# Patient Record
Sex: Male | Born: 1975 | Race: White | Hispanic: No | Marital: Married | Smoking: Never smoker
Health system: Southern US, Community
[De-identification: ages and names within clinical notes are randomized; demographics above are authoritative.]

## PROBLEM LIST (undated history)

## (undated) DIAGNOSIS — F32A Depression, unspecified: Secondary | ICD-10-CM

---

## 2008-06-26 ENCOUNTER — Ambulatory Visit: Payer: Self-pay | Admitting: Sports Medicine

## 2008-06-26 DIAGNOSIS — M629 Disorder of muscle, unspecified: Secondary | ICD-10-CM

## 2009-11-04 ENCOUNTER — Ambulatory Visit: Payer: Self-pay | Admitting: Emergency Medicine

## 2009-11-04 DIAGNOSIS — S6990XA Unspecified injury of unspecified wrist, hand and finger(s), initial encounter: Secondary | ICD-10-CM

## 2009-11-04 DIAGNOSIS — S59909A Unspecified injury of unspecified elbow, initial encounter: Secondary | ICD-10-CM

## 2009-11-04 DIAGNOSIS — S59919A Unspecified injury of unspecified forearm, initial encounter: Secondary | ICD-10-CM

## 2009-11-12 ENCOUNTER — Telehealth: Payer: Self-pay | Admitting: Family Medicine

## 2009-11-12 ENCOUNTER — Telehealth (INDEPENDENT_AMBULATORY_CARE_PROVIDER_SITE_OTHER): Payer: Self-pay | Admitting: *Deleted

## 2010-01-01 ENCOUNTER — Ambulatory Visit: Payer: Self-pay | Admitting: Emergency Medicine

## 2010-04-17 ENCOUNTER — Encounter: Payer: Self-pay | Admitting: Sports Medicine

## 2010-04-26 NOTE — Assessment & Plan Note (Signed)
Summary: LEFT WRIST PAIN   Vital Signs:  Patient Profile:   35 Years Old Male CC:      Left wrist pain from fall last night Height:     66 inches Weight:      153 pounds O2 Sat:      100 % O2 treatment:    Room Air Temp:     97.8 degrees F oral Pulse rate:   53 / minute Pulse rhythm:   regular Resp:     12 per minute BP sitting:   119 / 77  (left arm) Cuff size:   regular  Pt. in pain?   yes    Location:   wrist    Intensity:   8    Type:       sharp  Vitals Entered By: Emilio Math (November 04, 2009 10:05 AM)                 History of Present Illness Chief Complaint: Left wrist pain from fall last night History of Present Illness: 35 yo M here for left wrist pain.  Patient reports was playing softball last night and during game fell onto outstretched left wrist.  Is right handed.  No prior fracture of this wrist.  Maybe some mild swelling.  Feels very stiff now.  No numbness or tingling.  Pain ulnar side of wrist.  REVIEW OF SYSTEMS Constitutional Symptoms      Denies fever, chills, night sweats, weight loss, weight gain, and fatigue.  Eyes       Denies change in vision, eye pain, eye discharge, glasses, contact lenses, and eye surgery. Ear/Nose/Throat/Mouth       Denies hearing loss/aids, change in hearing, ear pain, ear discharge, dizziness, frequent runny nose, frequent nose bleeds, sinus problems, sore throat, hoarseness, and tooth pain or bleeding.  Respiratory       Denies dry cough, productive cough, wheezing, shortness of breath, asthma, bronchitis, and emphysema/COPD.  Cardiovascular       Denies murmurs, chest pain, and tires easily with exhertion.    Gastrointestinal       Denies stomach pain, nausea/vomiting, diarrhea, constipation, blood in bowel movements, and indigestion. Genitourniary       Denies painful urination, kidney stones, and loss of urinary control. Neurological       Denies paralysis, seizures, and fainting/blackouts. Musculoskeletal     Complains of muscle pain, joint pain, joint stiffness, and decreased range of motion.      Denies redness, swelling, muscle weakness, and gout.  Skin       Denies bruising, unusual mles/lumps or sores, and hair/skin or nail changes.  Psych       Denies mood changes, temper/anger issues, anxiety/stress, speech problems, depression, and sleep problems.  Past History:  Past Medical History: Reviewed history from 06/26/2008 and no changes required. 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 Non smoker ETOH-yes No [employer] Race team Physical Exam General appearance: well developed, well nourished, no acute distress Extremities: L wrist: No gross deformity, swelling, or bruising.  TTP at [facility].  No focal bony TTP ulnar styloid, carpal bones.  ROM full flexion but only 30 degrees extension.  Sensation intact to light touch distally.  No malrotation/angulation fo fingers.  Cap refill < 2 secs. Assessment New Problems: WRIST INJURY, LEFT (ICD-959.3)  TFCC injury - contusion vs tear.  Patient Education: seen by Dr.  Hudnall  Plan New Orders: T-DG Wrist Complete*L* [73110] New Patient Level II [99202] Wrist/Thumb Spica [L3923] Planning Comments:   Treat conservatively - wrist splint to wear regularly for next 10-14 days, icing, nsaids.  Then start rehab exercises. If worsening instead of improving may need mr arthrogram to further characterize tear and consider arthroscopy - given number for orthopedic physicians.  Follow Up: Follow up on an as needed basis  The patient and/or caregiver has been counseled thoroughly with regard to medications prescribed including dosage, schedule, interactions, rationale for use, and possible side effects and they verbalize understanding.  Diagnoses and expected course of recovery discussed and will return if not improved as  expected or if the condition worsens. Patient and/or caregiver verbalized understanding.   Patient Instructions: 1)  Ice area 3-4 times a day for 15 minutes at a time 2)  Wear the wrist brace as much as possible, even when sleeping.  Can take this off if necessary.  Can take off to shower as well. 3)  Aleve 2 tabs twice a day with food for 7 days then as needed for pain and inflammation. 4)  After 10 days if you have improved, can come out of wrist brace and do simple motion exercises (see handout) to get your motion back.  5)  If the same or worse in 10-14 days, call [phone] to set up an appointment with the orthopedic doctors downstairs.  Orders Added: 1)  T-DG Wrist Complete*L* [73110] 2)  New Patient Level II [99202] 3)  Wrist/Thumb Spica [L3923]

## 2010-04-26 NOTE — Miscellaneous (Signed)
Summary: Directives and Consents  Directives and Consents   Imported By: Joanne Chars CMA 01/01/2010 15:56:00  _____________________________________________________________________  External Attachment:    Type:   Image     Comment:   External Document

## 2010-04-26 NOTE — Progress Notes (Signed)
  Phone Note Call from Patient   Caller: Patient Summary of Call: Patient called states that his wrist is not any better. Does the patient need to come back in to be seen? If you need to contact his the best number is [phone]. Initial call taken by: Dannette Barbara,  November 12, 2009 9:55 AM    Returned call; patient reports that he has had no improvement while wearing splint.  Still has sharp pain on dorsal ulnar aspect of wrist with movement.  Will refer to orthopedist or sports med specialist. Donna Christen MD  November 12, 2009 11:33 AM

## 2010-04-26 NOTE — Assessment & Plan Note (Signed)
Summary: FLU SHOT/TM   Flu Vaccine Consent Questions     Do you have a history of severe allergic reactions to this vaccine? no    Any prior history of allergic reactions to egg and/or gelatin? no    Do you have a sensitivity to the preservative Thimersol? no    Do you have a past history of Guillan-Barre Syndrome? no    Do you currently have an acute febrile illness? no 98.3    Have you ever had a severe reaction to latex? no    Vaccine information given and explained to patient? yes    Are you currently pregnant? no    Lot Number: [id number]   Exp Date: 09/24/2010   Manufacturer: [employer]    Site Given  Right Deltoid IM Areta Haber CMA  January 01, 2010 11:49 AM       Orders Added: 1)  Admin 1st Vaccine [90471] 2)  Flu Vaccine 5yrs + [29528]

## 2010-04-26 NOTE — Progress Notes (Signed)
  Phone Note From Other Clinic   Caller: Appointment Secretary Summary of Call: Patient scheduled with Dr Dorthula [facility].  Scheduled Wednesday August 24,2011 at 8:45 am at the [facility] location. Initial call taken by: Junius Finner,  November 12, 2009 2:44 PM

## 2013-09-04 ENCOUNTER — Encounter (HOSPITAL_COMMUNITY): Payer: Self-pay

## 2013-09-04 ENCOUNTER — Ambulatory Visit (HOSPITAL_COMMUNITY)
Admission: RE | Admit: 2013-09-04 | Discharge: 2013-09-04 | Disposition: A | Discharge status: Home / Self Care | Payer: BC Managed Care – PPO | Source: Ambulatory Visit | Attending: Family Medicine | Admitting: Family Medicine

## 2013-09-04 ENCOUNTER — Other Ambulatory Visit (HOSPITAL_COMMUNITY): Payer: Self-pay | Admitting: Family Medicine

## 2013-09-04 DIAGNOSIS — R51 Headache: Secondary | ICD-10-CM

## 2014-12-05 IMAGING — CT CT HEAD W/O CM
1 series · 16 of 30 positions shown, 20 images · non-contrast
Comparison: None.

CLINICAL DATA: Headaches.

EXAM:
CT HEAD WITHOUT CONTRAST
TECHNIQUE: Contiguous axial images were obtained from the base of the skull
through the vertex without intravenous contrast.

[Series 2: headtrauma 4.8 h37s · axial · 0.45mm/px · z∈[+148,+303]mm · 16 of 36 slices shown, 20 images]
[im 2/36  brain]
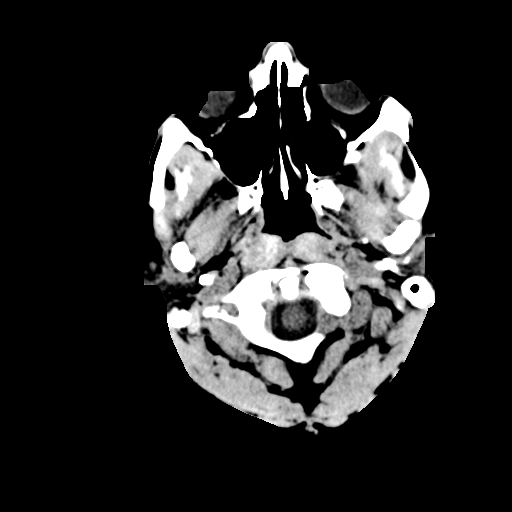
[im 2/36  bone]
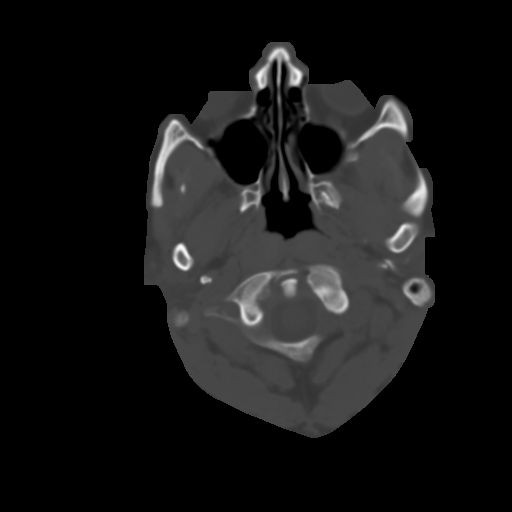
[im 4/36  brain]
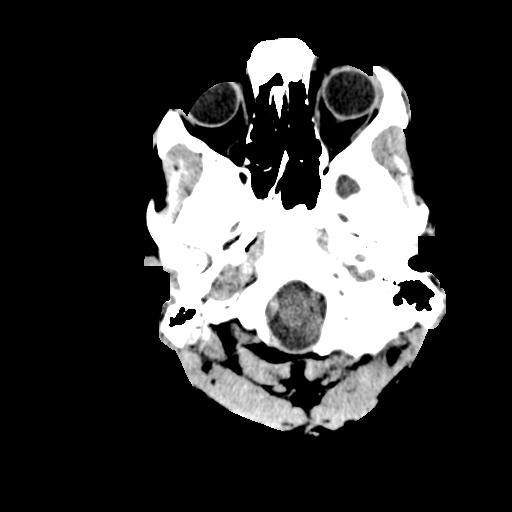
[im 7/36  brain]
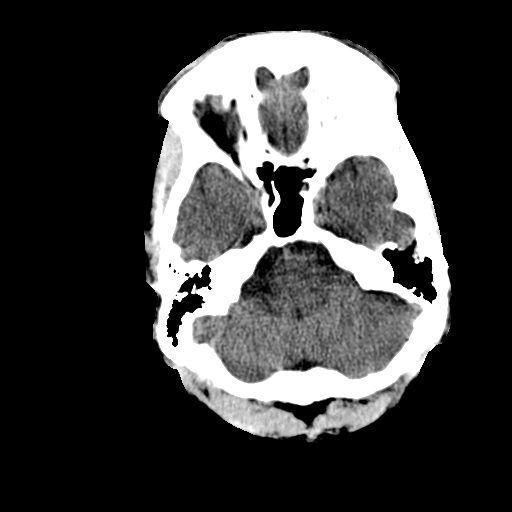
[im 9/36  brain]
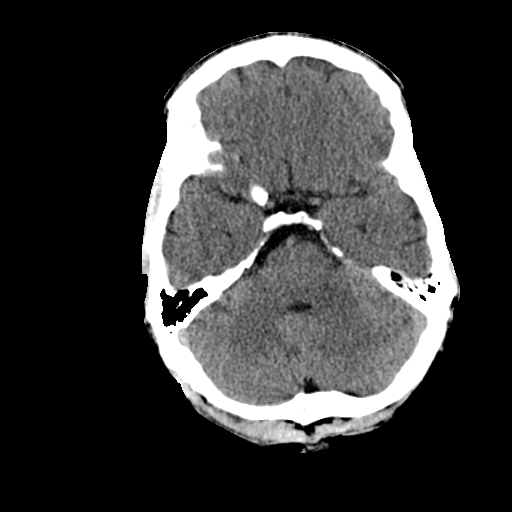
[im 10/36  brain]
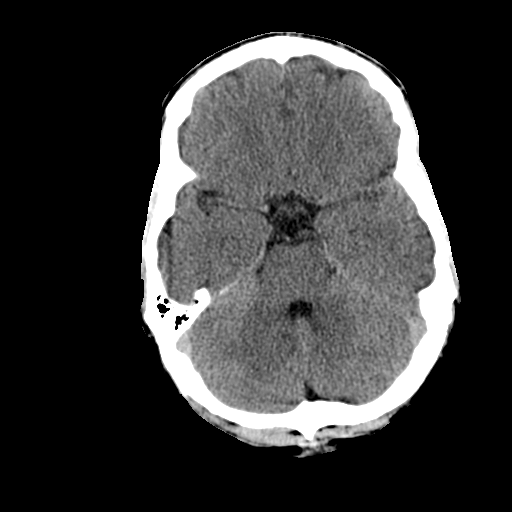
[im 10/36  bone]
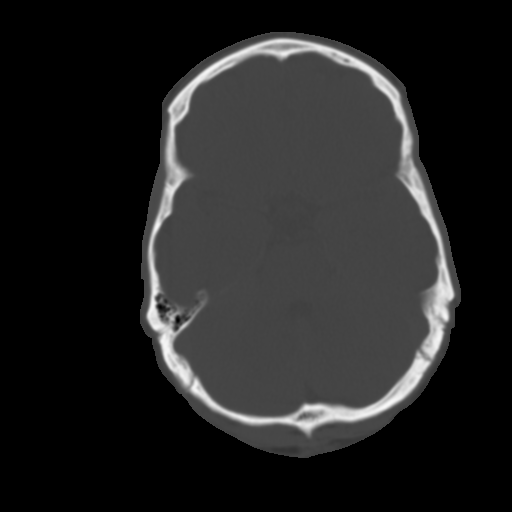
[im 13/36  brain]
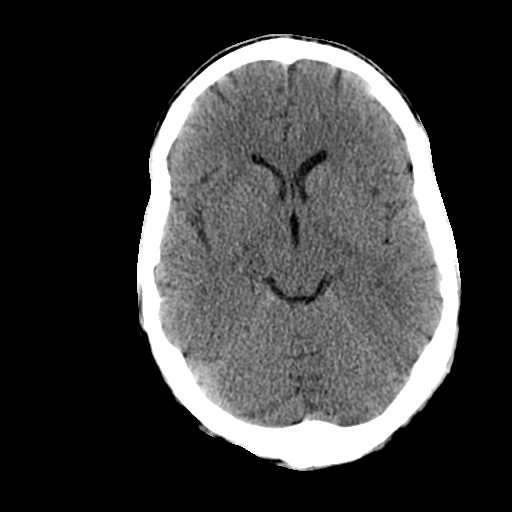
[im 15/36  brain]
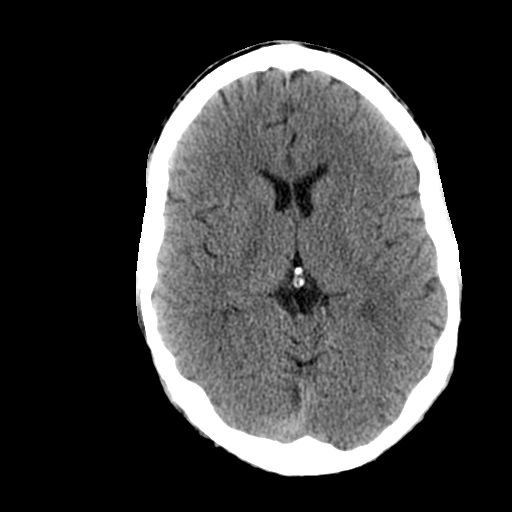
[im 17/36  brain]
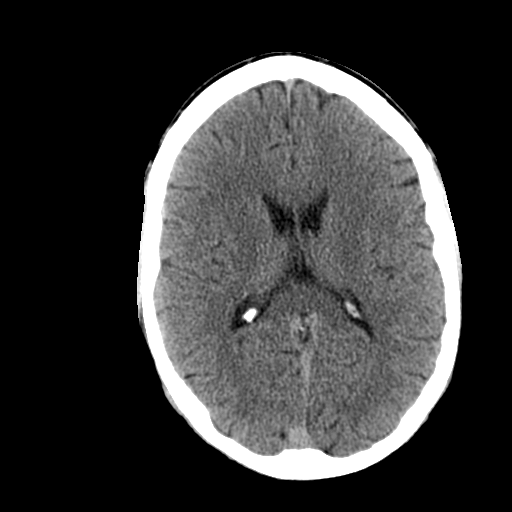
[im 19/36  brain]
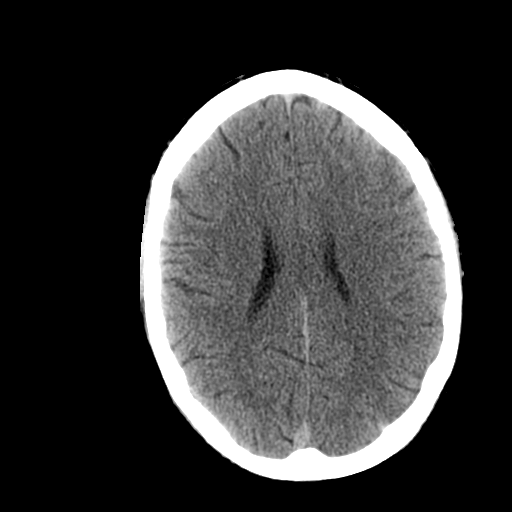
[im 19/36  bone]
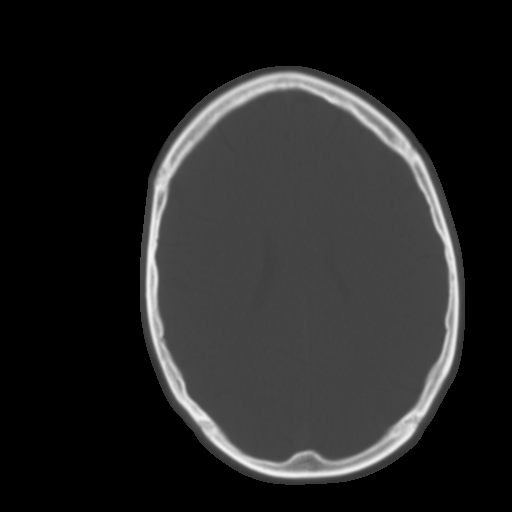
[im 21/36  brain]
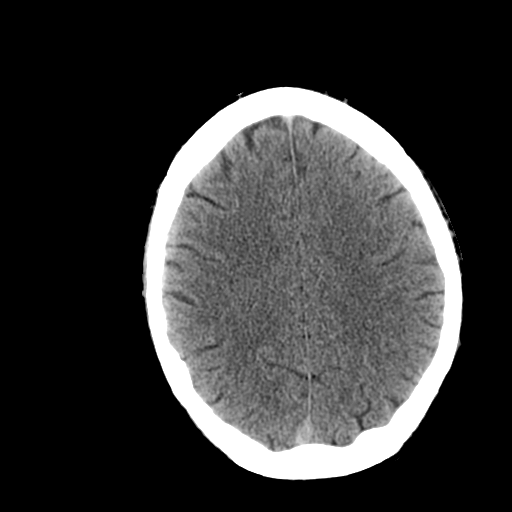
[im 23/36  brain]
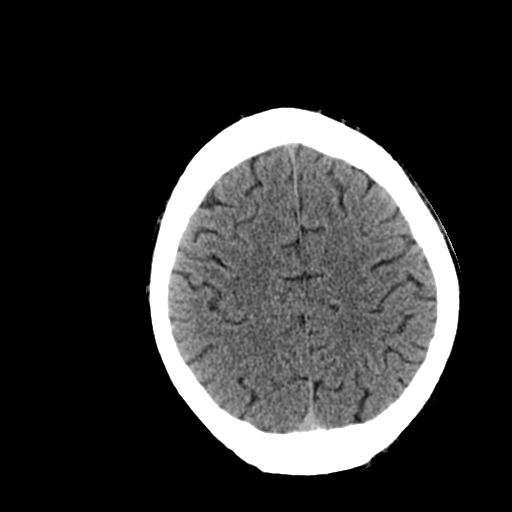
[im 26/36  brain]
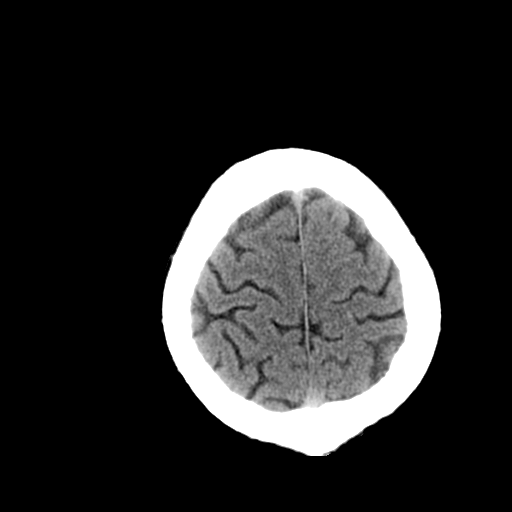
[im 27/36  brain]
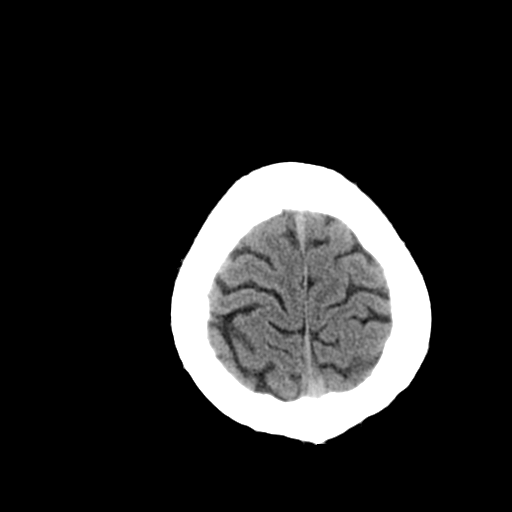
[im 27/36  bone]
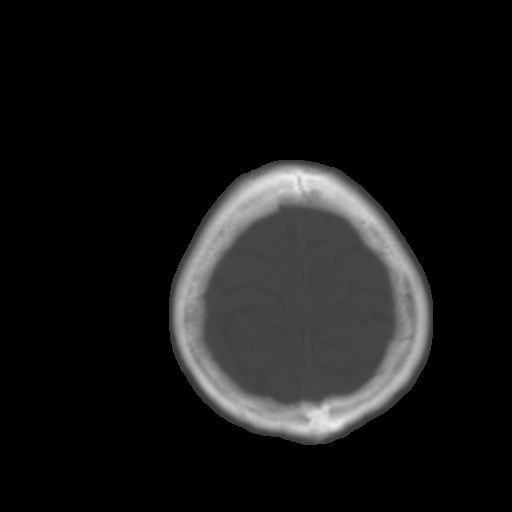
[im 29/36  brain]
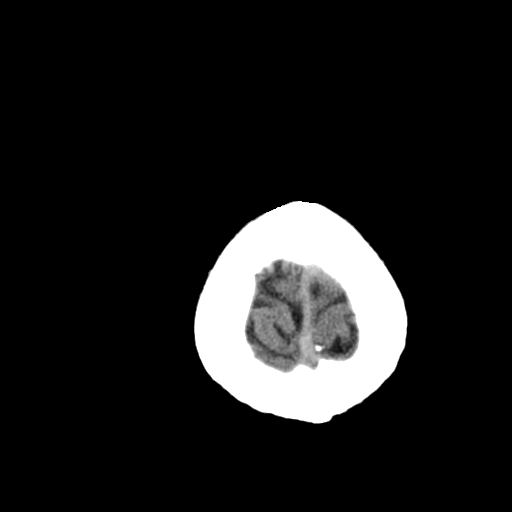
[im 32/36  brain]
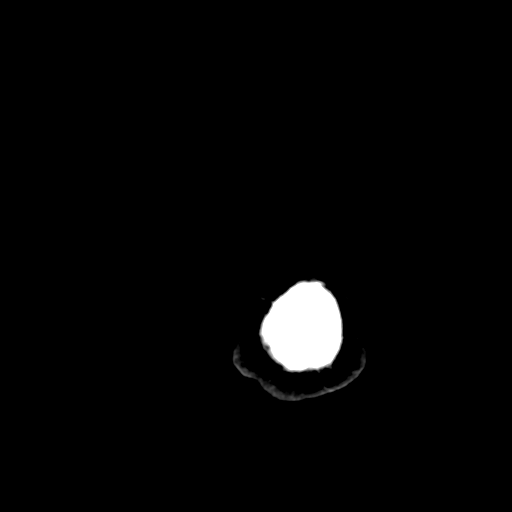
[im 34/36  brain]
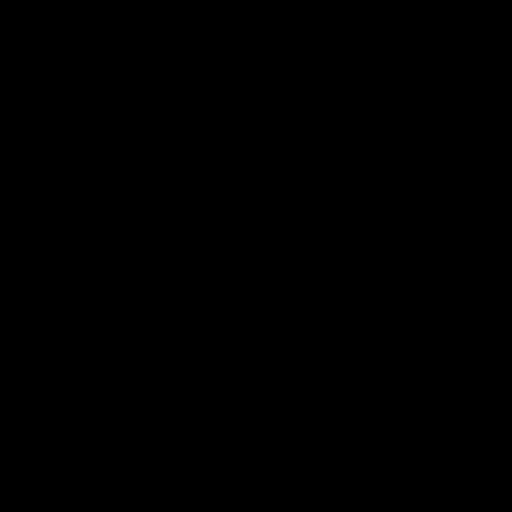

[16 of 30 positions shown; findings below may reference images not displayed]

FINDINGS: The ventricles are normal in size and configuration. No extra-axial
fluid collections are identified. The gray-white differentiation is
normal. No CT findings for acute intracranial process such as
hemorrhage or infarction. No mass lesions. The brainstem and
cerebellum are grossly normal.

The bony structures are intact. The paranasal sinuses and mastoid
air cells are clear. The globes are intact.
IMPRESSION: No acute intracranial findings or mass lesions.

## 2016-11-06 ENCOUNTER — Encounter: Payer: Self-pay | Admitting: Emergency Medicine

## 2016-11-06 ENCOUNTER — Emergency Department (INDEPENDENT_AMBULATORY_CARE_PROVIDER_SITE_OTHER)
Admission: EM | Admit: 2016-11-06 | Discharge: 2016-11-06 | Disposition: A | Discharge status: Home / Self Care | Payer: BLUE CROSS/BLUE SHIELD | Source: Home / Self Care | Attending: Family Medicine | Admitting: Family Medicine

## 2016-11-06 DIAGNOSIS — J0101 Acute recurrent maxillary sinusitis: Secondary | ICD-10-CM

## 2016-11-06 MED ORDER — AMOXICILLIN-POT CLAVULANATE 875-125 MG PO TABS: 1.0000 | ORAL_TABLET | Freq: Two times a day (BID) | ORAL | 0 refills | Status: AC

## 2016-11-06 NOTE — Discharge Instructions (Signed)
Take Pseudoephedrine (30mg , one or two every 4 to 6 hours) for sinus congestion.  If cough develops, take plain guaifenesin (1200mg  extended release tabs such as Mucinex) twice daily, with plenty of water.  Get adequate rest.   May use Afrin nasal spray (or generic oxymetazoline) each morning for about 5 days and then discontinue.  Also recommend using saline nasal spray several times daily and saline nasal irrigation (AYR is a common brand).  Use Flonase nasal spray each morning after using Afrin nasal spray and saline nasal irrigation. Try warm salt water gargles for sore throat.  May take Delsym Cough Suppressant at bedtime for nighttime cough.  Stop all antihistamines for now, and other non-prescription cough/cold preparations. May take Ibuprofen 200mg , 4 tabs every 8 hours with food for body aches, fever, etc.   Follow-up with family doctor if not improving about 7 to 10 days.

## 2016-11-06 NOTE — ED Provider Notes (Signed)
[facility]    CSN: [id number] Arrival date & time: 11/06/16  1703     History   Chief Complaint Chief Complaint  Patient presents with  . Nasal Congestion    HPI Gardenia PhlegmMatthew Kegel is a 41 y.o. male.   [place] and yesterday he developed sinus congestion and pressure, chills, myalgias, and fatigue.  No sore throat or cough.  His symptoms have not improved with Sudafed and Zyrtec.  He is concerned that he may have a sinus infection.      History reviewed. No pertinent past medical history.  Patient Active Problem List   Diagnosis Date Noted  . WRIST INJURY, LEFT 11/04/2009  . 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 No pertinent surgical history.     Home Medications    Prior to Admission medications   Medication Sig Start Date End Date Taking? Authorizing Provider  cetirizine (ZYRTEC) 10 MG tablet Take 10 mg by mouth daily.   Yes [provider]  pseudoephedrine (SUDAFED) 30 MG tablet Take 30 mg by mouth every 4 (four) hours as needed for congestion.   Yes [provider]  amoxicillin-clavulanate (AUGMENTIN) 875-125 MG tablet Take 1 tablet by mouth 2 (two) times daily. Take with food 11/06/16   Lattie HawBeese, Reine Bristow A, MD    Family History No family history on file.  Social History Social History  Substance Use Topics  . Smoking status: Never Smoker  . Smokeless tobacco: Never Used  . Alcohol use No     Allergies   Patient has no known allergies.   Review of Systems Review of Systems No sore throat No cough No pleuritic pain No wheezing + nasal congestion + post-nasal drainage + sinus pain/pressure No itchy/red eyes No earache No hemoptysis No SOB No fever, + chills No nausea No vomiting No abdominal pain No diarrhea No urinary symptoms No skin rash + fatigue +No myalgias No headache Used OTC meds without relief   Physical Exam Triage Vital Signs ED Triage  Vitals  Enc Vitals Group     BP 11/06/16 1757 125/76     Pulse Rate 11/06/16 1757 (!) 56     Resp --      Temp 11/06/16 1757 98.6 F (37 C)     Temp Source 11/06/16 1757 Oral     SpO2 11/06/16 1757 100 %     Weight 11/06/16 1758 154 lb (69.9 kg)     Height 11/06/16 1758 5\' 5"  (1.651 m)     Head Circumference --      Peak Flow --      Pain Score 11/06/16 1758 4     Pain Loc --      Pain Edu? --      Excl. in [place]? --    No data found.   Updated Vital Signs BP 125/76 (BP Location: Left Arm)   Pulse (!) 56   Temp 98.6 F (37 C) (Oral)   Ht 5\' 5"  (1.651 m)   Wt 154 lb (69.9 kg)   SpO2 100%   BMI 25.63 kg/m   Visual Acuity Right Eye Distance:   Left Eye Distance:   Bilateral Distance:    Right Eye Near:   Left Eye Near:    Bilateral Near:     Physical Exam Nursing notes and Vital Signs reviewed. Appearance:  Patient appears stated age, and in no acute distress Eyes:  Pupils are equal, round,  and reactive to light and accomodation.  Extraocular movement is intact.  Conjunctivae are not inflamed  Ears:  Canals normal.  Tympanic membranes normal.  Nose:  Congested turbinates.  Maxillary sinus tenderness is present.  Pharynx:  Normal Neck:  Supple.  Enlarged posterior/lateral nodes are palpated bilaterally, tender to palpation on the left.   Lungs:  Clear to auscultation.  Breath sounds are equal.  Moving air well. Heart:  Regular rate and rhythm without murmurs, rubs, or gallops.  Abdomen:  Nontender without masses or hepatosplenomegaly.  Bowel sounds are present.  No CVA or flank tenderness.  Extremities:  No edema.  Skin:  No rash present.    UC Treatments / Results  Labs (all labs ordered are listed, but only abnormal results are displayed) Labs Reviewed - No data to display  EKG  EKG Interpretation None       Radiology No results found.  Procedures Procedures (including critical care time)  Medications Ordered in UC Medications - No data to  display   Initial Impression / Assessment and Plan / UC Course  I have reviewed the triage vital signs and the nursing notes.  Pertinent labs & imaging results that were available during my care of the patient were reviewed by me and considered in my medical decision making (see chart for details).    ?early viral URI. Begin empiric Augmentin. Take Pseudoephedrine (30mg , one or two every 4 to 6 hours) for sinus congestion.  If cough develops, take plain guaifenesin (1200mg  extended release tabs such as Mucinex) twice daily, with plenty of water.  Get adequate rest.   May use Afrin nasal spray (or generic oxymetazoline) each morning for about 5 days and then discontinue.  Also recommend using saline nasal spray several times daily and saline nasal irrigation (AYR is a common brand).  Use Flonase nasal spray each morning after using Afrin nasal spray and saline nasal irrigation. Try warm salt water gargles for sore throat.  May take Delsym Cough Suppressant at bedtime for nighttime cough.  Stop all antihistamines for now, and other non-prescription cough/cold preparations. May take Ibuprofen 200mg , 4 tabs every 8 hours with food for body aches, fever, etc.   Follow-up with family doctor if not improving about 7 to 10 days.     Final Clinical Impressions(s) / UC Diagnoses   Final diagnoses:  Acute recurrent maxillary sinusitis    New Prescriptions New Prescriptions   AMOXICILLIN-CLAVULANATE (AUGMENTIN) 875-125 MG TABLET    Take 1 tablet by mouth 2 (two) times daily. Take with food         Lattie Haw, MD 11/15/16 1014

## 2019-12-22 ENCOUNTER — Ambulatory Visit: Payer: Self-pay

## 2022-05-01 ENCOUNTER — Ambulatory Visit
Admission: EM | Admit: 2022-05-01 | Discharge: 2022-05-01 | Disposition: A | Discharge status: Home / Self Care | Payer: BC Managed Care – PPO | Attending: Family Medicine | Admitting: Family Medicine

## 2022-05-01 DIAGNOSIS — J0100 Acute maxillary sinusitis, unspecified: Secondary | ICD-10-CM

## 2022-05-01 DIAGNOSIS — H5711 Ocular pain, right eye: Secondary | ICD-10-CM | POA: Diagnosis not present

## 2022-05-01 HISTORY — DX: Depression, unspecified: F32.A

## 2022-05-01 MED ORDER — AMOXICILLIN 875 MG PO TABS: ORAL_TABLET | ORAL | 0 refills | Status: AC

## 2022-05-01 NOTE — ED Provider Notes (Signed)
[facility]    CSN: [id number] Arrival date & time: 05/01/22  2841      History   Chief Complaint Chief Complaint  Patient presents with   Eye Problem   Nasal Congestion    HPI Leven Hoel is a 47 y.o. male.   Patient complains of persistent sinus congestion for about two weeks, and this morning awoke with right eye soreness, redness and drainage, now improved.  He recalls no foreign body to his eye, and denies pain with eye movement.  He does not wear contacts.  He denies fevers, chills, and sweats.  The history is provided by the patient.    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 No pertinent surgical history.     Home Medications    Prior to Admission medications   Medication Sig Start Date End Date Taking? Authorizing Provider  amoxicillin (AMOXIL) 875 MG tablet Take one tab PO Q12hr 05/01/22  Yes Kandra Nicolas, MD  buPROPion (WELLBUTRIN XL) 150 MG 24 hr tablet Take by mouth. 04/22/22  Yes [provider]  escitalopram (LEXAPRO) 10 MG tablet Take by mouth. 03/10/22  Yes [provider]  amoxicillin-clavulanate (AUGMENTIN) 875-125 MG tablet Take 1 tablet by mouth 2 (two) times daily. Take with food 11/06/16   Kandra Nicolas, MD  cetirizine (ZYRTEC) 10 MG tablet Take 10 mg by mouth daily.    [provider]  pseudoephedrine (SUDAFED) 30 MG tablet Take 30 mg by mouth every 4 (four) hours as needed for congestion.    [provider]    Family History Family History  Problem Relation Age of Onset   Healthy Mother    Cancer Father     Social History Social History   Tobacco Use   Smoking status: Never   Smokeless tobacco: Never  Vaping Use   Vaping Use: Never used  Substance Use Topics   Alcohol use: No   Drug use: No     Allergies   Patient has  no known allergies.   Review of Systems Review of Systems No sore throat No cough No pleuritic pain No wheezing + nasal congestion + post-nasal drainage + sinus pain/pressure + red right eye No earache No hemoptysis No SOB No fever/chills No nausea No vomiting No abdominal pain No diarrhea No urinary symptoms No skin rash No fatigue No myalgias No headache   Physical Exam Triage Vital Signs ED Triage Vitals  Enc Vitals Group     BP 05/01/22 0850 128/89     Pulse Rate 05/01/22 0850 75     Resp 05/01/22 0850 20     Temp 05/01/22 0850 99.2 F (37.3 C)     Temp Source 05/01/22 0850 Oral     SpO2 05/01/22 0850 97 %     Weight 05/01/22 0844 198 lb (89.8 kg)     Height 05/01/22 0844 5\' 6"  (1.676 m)     Head Circumference --      Peak Flow --      Pain Score 05/01/22 0844 5     Pain Loc --      Pain Edu? --      Excl. in [place]? --    No data found.  Updated Vital Signs BP 128/89 (BP Location: Right Arm)   Pulse 75  Temp 99.2 F (37.3 C) (Oral)   Resp 20   Ht 5\' 6"  (1.676 m)   Wt 89.8 kg   SpO2 97%   BMI 31.96 kg/m   Visual Acuity Right Eye Distance:   Left Eye Distance:   Bilateral Distance:    Right Eye Near:   Left Eye Near:    Bilateral Near:     Physical Exam Nursing notes and Vital Signs reviewed. Appearance:  Patient appears stated age, and in no acute distress Eyes:  Pupils are equal, round, and reactive to light and accomodation.  Extraocular movement is intact. Right conjunctivae minimally injected.  No photophobia.  Right fundi normal.  No right eyelid tenderness, erythema, or swelling.  Right lid eversion reveals no foreign body.  Fluorescein to the right eye shows no uptake. Ears:  Canals normal.  Tympanic membranes normal.  Nose:  Mildly congested turbinates.  Mild maxillary sinus tenderness is present.  Pharynx:  Normal Neck:  Supple. No adenopathy. Lungs:  Clear to auscultation.  Breath sounds are equal.  Moving air well. Heart:   Regular rate and rhythm without murmurs, rubs, or gallops.  Abdomen:  Nontender without masses or hepatosplenomegaly.  Bowel sounds are present.  No CVA or flank tenderness.  Extremities:  No edema.  Skin:  No rash present.   UC Treatments / Results  Labs (all labs ordered are listed, but only abnormal results are displayed) Labs Reviewed - No data to display  EKG   Radiology No results found.  Procedures Procedures (including critical care time)  Medications Ordered in UC Medications - No data to display  Initial Impression / Assessment and Plan / UC Course  I have reviewed the triage vital signs and the nursing notes.  Pertinent labs & imaging results that were available during my care of the patient were reviewed by me and considered in my medical decision making (see chart for details).    No evidence foreign body or abrasion right eye.  No evidence preseptal cellulitis.  Will treat symptomatically: use lubricating eye drops as needed. Begin amoxicillin 875mg  BID for one week. Followup with ophthalmologist if right eye symptoms persist at five days.  Final Clinical Impressions(s) / UC Diagnoses   Final diagnoses:  Acute maxillary sinusitis, recurrence not specified  Acute right eye pain     Discharge Instructions      May continue decongestant for sinus congestion.    May use Afrin nasal spray (or generic oxymetazoline) each morning for about 5 days and then discontinue.  Also recommend using saline nasal spray several times daily and saline nasal irrigation (AYR is a common brand).  Use Flonase nasal spray each morning after using Afrin nasal spray and saline nasal irrigation. Use lubricating eye drops in right eye as needed.    ED Prescriptions     Medication Sig Dispense Auth. Provider   amoxicillin (AMOXIL) 875 MG tablet Take one tab PO Q12hr 14 tablet Kandra Nicolas, MD         Kandra Nicolas, MD 05/03/22 1049

## 2022-05-01 NOTE — Discharge Instructions (Signed)
May continue decongestant for sinus congestion.    May use Afrin nasal spray (or generic oxymetazoline) each morning for about 5 days and then discontinue.  Also recommend using saline nasal spray several times daily and saline nasal irrigation (AYR is a common brand).  Use Flonase nasal spray each morning after using Afrin nasal spray and saline nasal irrigation. Use lubricating eye drops in right eye as needed.

## 2022-05-01 NOTE — ED Triage Notes (Signed)
Pt presents to Urgent Care with c/o nasal congestion x nearly 2 weeks and R eye soreness, redness, and drainage this morning.
